# Patient Record
Sex: Female | Born: 1990
Health system: Southern US, Community
[De-identification: ages and names within clinical notes are randomized; demographics above are authoritative.]

## PROBLEM LIST (undated history)

## (undated) DIAGNOSIS — I495 Sick sinus syndrome: Secondary | ICD-10-CM

## (undated) DIAGNOSIS — Q249 Congenital malformation of heart, unspecified: Secondary | ICD-10-CM

## (undated) DIAGNOSIS — I441 Atrioventricular block, second degree: Secondary | ICD-10-CM

## (undated) DIAGNOSIS — I4892 Unspecified atrial flutter: Secondary | ICD-10-CM

## (undated) HISTORY — PX: SEPTOSTOMY: SHX2394

## (undated) HISTORY — PX: PACEMAKER INSERTION: SHX728

---
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
## 2011-11-26 DIAGNOSIS — I441 Atrioventricular block, second degree: Secondary | ICD-10-CM

## 2011-11-26 DIAGNOSIS — I495 Sick sinus syndrome: Secondary | ICD-10-CM

## 2011-11-26 HISTORY — DX: Atrioventricular block, second degree: I44.1

## 2011-11-26 HISTORY — DX: Sick sinus syndrome: I49.5

## 2013-07-01 DIAGNOSIS — I4892 Unspecified atrial flutter: Secondary | ICD-10-CM

## 2013-07-01 HISTORY — DX: Unspecified atrial flutter: I48.92

## 2013-11-25 HISTORY — PX: TYMPANOPLASTY W/ MASTOIDECTOMY: SUR1400

## 2014-09-15 ENCOUNTER — Ambulatory Visit: Payer: Self-pay | Admitting: Unknown Physician Specialty

## 2014-11-19 ENCOUNTER — Emergency Department (HOSPITAL_COMMUNITY)
Admission: EM | Admit: 2014-11-19 | Discharge: 2014-11-19 | Disposition: A | Discharge status: Home / Self Care | Payer: BC Managed Care – PPO | Attending: Emergency Medicine | Admitting: Emergency Medicine

## 2014-11-19 ENCOUNTER — Emergency Department (HOSPITAL_COMMUNITY): Payer: BC Managed Care – PPO

## 2014-11-19 ENCOUNTER — Emergency Department (INDEPENDENT_AMBULATORY_CARE_PROVIDER_SITE_OTHER)
Admission: EM | Admit: 2014-11-19 | Discharge: 2014-11-19 | Disposition: A | Discharge status: Other Acute Inpatient Hospital | Payer: BC Managed Care – PPO | Source: Home / Self Care | Attending: Family Medicine | Admitting: Family Medicine

## 2014-11-19 ENCOUNTER — Encounter (HOSPITAL_COMMUNITY): Payer: Self-pay | Admitting: Family Medicine

## 2014-11-19 ENCOUNTER — Encounter (HOSPITAL_COMMUNITY): Payer: Self-pay | Admitting: *Deleted

## 2014-11-19 DIAGNOSIS — Z8679 Personal history of other diseases of the circulatory system: Secondary | ICD-10-CM | POA: Insufficient documentation

## 2014-11-19 DIAGNOSIS — Z7982 Long term (current) use of aspirin: Secondary | ICD-10-CM | POA: Diagnosis not present

## 2014-11-19 DIAGNOSIS — R079 Chest pain, unspecified: Secondary | ICD-10-CM

## 2014-11-19 DIAGNOSIS — Q249 Congenital malformation of heart, unspecified: Secondary | ICD-10-CM | POA: Diagnosis not present

## 2014-11-19 DIAGNOSIS — Z950 Presence of cardiac pacemaker: Secondary | ICD-10-CM | POA: Diagnosis not present

## 2014-11-19 DIAGNOSIS — R0789 Other chest pain: Secondary | ICD-10-CM | POA: Diagnosis not present

## 2014-11-19 DIAGNOSIS — M25512 Pain in left shoulder: Secondary | ICD-10-CM | POA: Diagnosis not present

## 2014-11-19 DIAGNOSIS — Z79899 Other long term (current) drug therapy: Secondary | ICD-10-CM | POA: Insufficient documentation

## 2014-11-19 DIAGNOSIS — R52 Pain, unspecified: Secondary | ICD-10-CM

## 2014-11-19 HISTORY — DX: Atrioventricular block, second degree: I44.1

## 2014-11-19 HISTORY — DX: Congenital malformation of heart, unspecified: Q24.9

## 2014-11-19 HISTORY — DX: Unspecified atrial flutter: I48.92

## 2014-11-19 HISTORY — DX: Sick sinus syndrome: I49.5

## 2014-11-19 LAB — BASIC METABOLIC PANEL
Anion gap: 6 (ref 5–15)
BUN: 9 mg/dL (ref 6–23)
CHLORIDE: 108 meq/L (ref 96–112)
CO2: 24 mmol/L (ref 19–32)
CREATININE: 0.56 mg/dL (ref 0.50–1.10)
Calcium: 9.3 mg/dL (ref 8.4–10.5)
GFR calc Af Amer: >90 mL/min (ref >90)
GFR calc non Af Amer: >90 mL/min (ref >90)
GLUCOSE: 90 mg/dL (ref 70–99)
Potassium: 4.3 mmol/L (ref 3.5–5.1)
Sodium: 138 mmol/L (ref 135–145)

## 2014-11-19 LAB — CBC
HCT: 34.5 % — ABNORMAL LOW (ref 36.0–46.0)
HEMOGLOBIN: 11.5 g/dL — AB (ref 12.0–15.0)
MCH: 27.8 pg (ref 26.0–34.0)
MCHC: 33.3 g/dL (ref 30.0–36.0)
MCV: 83.5 fL (ref 78.0–100.0)
Platelets: 111 10*3/uL — ABNORMAL LOW (ref 150–400)
RBC: 4.13 MIL/uL (ref 3.87–5.11)
RDW: 14.7 % (ref 11.5–15.5)
WBC: 4 10*3/uL (ref 4.0–10.5)

## 2014-11-19 LAB — DIGOXIN LEVEL: DIGOXIN LVL: 0.6 ng/mL — AB (ref 0.8–2.0)

## 2014-11-19 LAB — I-STAT TROPONIN, ED: Troponin i, poc: 0 ng/mL (ref 0.00–0.08)

## 2014-11-19 LAB — BRAIN NATRIURETIC PEPTIDE: B NATRIURETIC PEPTIDE 5: 64.5 pg/mL (ref 0.0–100.0)

## 2014-11-19 MED ORDER — OXYCODONE-ACETAMINOPHEN 5-325 MG PO TABS
ORAL_TABLET | ORAL | Status: AC
Start: 2014-11-19 — End: ?

## 2014-11-19 MED ORDER — METHOCARBAMOL 500 MG PO TABS: 1000.0000 mg | ORAL_TABLET | Freq: Four times a day (QID) | ORAL | Status: AC | PRN

## 2014-11-19 MED ORDER — OXYCODONE-ACETAMINOPHEN 5-325 MG PO TABS
2.0000 | ORAL_TABLET | Freq: Once | ORAL | Status: AC
  Administered 2014-11-19: 2 via ORAL
  Filled 2014-11-19: qty 2

## 2014-11-19 NOTE — ED Provider Notes (Signed)
CSN: [id number]     Arrival date & time 11/19/14  1006 History   First MD Initiated Contact with Patient 11/19/14 1143     Chief Complaint  Patient presents with  . Chest Pain      HPI Pt was seen at 1150. Per pt, c/o gradual onset and persistence of constant left upper anterior chest wall and shoulder "pain" for the past 4 to 5 days, worse over the past 2 days. Describes the pain as "aching," constant, and worsens with movement and coughing. Pt has taken tylenol without improvement. Denies palpitations, no SOB/cough, no abd pain, no N/V/D, no injury, no rash, no fevers, no calf/LE pain or unilateral swelling.    Past Medical History  Diagnosis Date  . Heart defect, congenital   . Atrial flutter 07/01/2013    s/p DCCV  . Second degree AV block 2013    2:1 conduction, progressive  . Sinus node dysfunction 2013   Past Surgical History  Procedure Laterality Date  . Pacemaker insertion  1995 placement, 2014 replaced    [facility]  . 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 Septostomy      atrial septostomy at [facility])  . Bidirectional glenn w/ atrial septectomy  1992    [facility]  . Back surgery  2007    spinal fusion for kyphosis  . Tympanoplasty w/ mastoidectomy Right 2015    [facility]    History  Substance Use Topics  . Smoking status: Never Smoker   . Smokeless tobacco: Not on file  . Alcohol Use: Yes    Review of Systems ROS: Statement: All systems negative except as marked or noted in the HPI; Constitutional: Negative for fever and chills. ; ; Eyes: Negative for eye pain, redness and discharge. ; ; ENMT: Negative for ear pain, hoarseness, nasal congestion, sinus pressure and sore throat. ; ; Cardiovascular: Negative for palpitations, diaphoresis, dyspnea and peripheral edema. ; ; Respiratory: Negative for cough, wheezing and stridor. ; ; Gastrointestinal: Negative for nausea, vomiting, diarrhea, abdominal pain, blood in stool, hematemesis,  jaundice and rectal bleeding. . ; ; Genitourinary: Negative for dysuria, flank pain and hematuria. ; ; Musculoskeletal: +upper chest wall and shoulder pain. Negative for back pain and neck pain. Negative for swelling and trauma.; ; Skin: Negative for pruritus, rash, abrasions, blisters, bruising and skin lesion.; ; Neuro: Negative for headache, lightheadedness and neck stiffness. Negative for weakness, altered level of consciousness , altered mental status, extremity weakness, paresthesias, involuntary movement, seizure and syncope.      Allergies  Propoxyphene  Home Medications   Prior to Admission medications   Medication Sig Start Date End Date Taking? Authorizing Provider  aspirin 325 MG tablet Take 325 mg by mouth daily.   Yes Historical Provider, MD  digoxin (LANOXIN) 0.125 MG tablet Take 0.125 mg by mouth daily.   Yes Historical Provider, MD  lisinopril (PRINIVIL,ZESTRIL) 5 MG tablet Take 5 mg by mouth daily.   Yes Historical Provider, MD  metoprolol succinate (TOPROL-XL) 25 MG 24 hr tablet Take 25 mg by mouth daily.   Yes Historical Provider, MD   BP 116/67 mmHg  Pulse 69  Temp(Src) 98.5 F (36.9 C) (Oral)  Resp 22  Ht 5\' 2"  (1.575 m)  Wt 170 lb (77.111 kg)  BMI 31.09 kg/m2  SpO2 94%  LMP 10/09/2014 Physical Exam  1155: Physical examination:  Nursing notes reviewed; Vital signs and O2 SAT reviewed;  Constitutional: Well developed, Well  nourished, Well hydrated, In no acute distress; Head:  Normocephalic, atraumatic; Eyes: EOMI, PERRL, No scleral icterus; ENMT: Mouth and pharynx normal, Mucous membranes moist; Neck: Supple, Full range of motion, No lymphadenopathy; Cardiovascular: Regular rate and rhythm, No gallop; Respiratory: Breath sounds clear & equal bilaterally, No wheezes.  Speaking full sentences with ease, Normal respiratory effort/excursion; Chest: +left upper anterior chest wall and anterior shoulder/deltoid area tender to palp, no rash, no deformity, no soft tissue  crepitus. Movement normal; Abdomen: Soft, Nontender, Nondistended, Normal bowel sounds; Genitourinary: No CVA tenderness; Extremities: Pulses normal, Left shoulder w/FROM.  +TTP anterior deltoid and upper chest wall areas, mild TTP left AC joint. Left clavicle NT, scapula NT, proximal humerus NT, biceps tendon NT over bicipital groove.  Motor strength at shoulder normal.  Sensation intact over deltoid region, distal NMS intact with left hand having intact and equal sensation and strength in the distribution of the median, radial, and ulnar nerve function compared to opposite side.  Strong radial pulse.  +FROM left elbow with intact motor strength biceps and triceps muscles to resistance. No edema, No calf edema or asymmetry.; Neuro: AA&Ox3, Major CN grossly intact.  Speech clear. No gross focal motor or sensory deficits in extremities.; Skin: Color normal, Warm, Dry.   ED Course  Procedures     EKG Interpretation   Date/Time:  Saturday November 19 2014 10:57:14 EST Ventricular Rate:  70 PR Interval:  184 QRS Duration: 136 QT Interval:  464 QTC Calculation: 501 R Axis:   90 Text Interpretation:  AV dual-paced rhythm When compared with ECG of  07/31/2001 No significant change was found Confirmed by [facility]  MD,  Nicholos JohnsKATHLEEN [phone]([id number]) on 11/19/2014 11:10:21 AM      MDM  MDM Reviewed: previous chart, nursing note and vitals Reviewed previous: labs and ECG Interpretation: labs, ECG and x-ray     Results for orders placed or performed during the hospital encounter of 11/19/14  CBC  Result Value Ref Range   WBC 4.0 4.0 - 10.5 K/uL   RBC 4.13 3.87 - 5.11 MIL/uL   Hemoglobin 11.5 (L) 12.0 - 15.0 g/dL   HCT 60.434.5 (L) 54.036.0 - 98.146.0 %   MCV 83.5 78.0 - 100.0 fL   MCH 27.8 26.0 - 34.0 pg   MCHC 33.3 30.0 - 36.0 g/dL   RDW 19.114.7 47.811.5 - 29.515.5 %   Platelets 111 (L) 150 - 400 K/uL  Basic metabolic panel  Result Value Ref Range   Sodium 138 135 - 145 mmol/L   Potassium 4.3 3.5 - 5.1 mmol/L    Chloride 108 96 - 112 mEq/L   CO2 24 19 - 32 mmol/L   Glucose, Bld 90 70 - 99 mg/dL   BUN 9 6 - 23 mg/dL   Creatinine, Ser 6.210.56 0.50 - 1.10 mg/dL   Calcium 9.3 8.4 - 30.810.5 mg/dL   GFR calc non Af Amer >90 >90 mL/min   GFR calc Af Amer >90 >90 mL/min   Anion gap 6 5 - 15  BNP (order ONLY if patient complains of dyspnea/SOB AND you have documented it for THIS visit)  Result Value Ref Range   B Natriuretic Peptide 64.5 0.0 - 100.0 pg/mL  Digoxin level  Result Value Ref Range   Digoxin Level 0.6 (L) 0.8 - 2.0 ng/mL  I-stat troponin, ED (not at [facility])  Result Value Ref Range   Troponin i, poc 0.00 0.00 - 0.08 ng/mL   Comment 3  Dg Chest 2 View 11/19/2014   CLINICAL DATA:  Left upper chest pain and anterior left shoulder pain 3 days worse since yesterday. No injury.  EXAM: CHEST  2 VIEW  COMPARISON:  08/31/2013  FINDINGS: Lungs are hypoinflated without focal consolidation or effusion. There is mild cardiomegaly unchanged. Posterior stabilization hardware is present from approximately T4 to the region of the lower lumbar spine and is incompletely visualized. The visualized portion of the hardware is intact. Minimal spondylosis of the spine.  IMPRESSION: No acute cardiopulmonary per  Mild stable cardiomegaly.   Electronically Signed   By: Elberta Fortisaniel  Boyle M.D.   On: 11/19/2014 13:55   Dg Shoulder Left 11/19/2014   CLINICAL DATA:  Left upper chest in shoulder pain for 4 days  EXAM: LEFT SHOULDER - 2+ VIEW  COMPARISON:  None.  FINDINGS: There is no evidence of fracture or dislocation. There is no evidence of arthropathy or other focal bone abnormality. Soft tissues are unremarkable.  IMPRESSION: Negative.   Electronically Signed   By: Maryclare BeanArt  Hoss M.D.   On: 11/19/2014 13:59    1405:  Feels better after pain meds and wants to go home now. Doubt PE as cause for symptoms with low risk [place].  Doubt ACS as cause for symptoms with normal troponin and unchanged EKG from previous after 4 days of constant  symptoms.  Appears msk pain. [facility] Selmer DominionIdriss, case discussed, including:  HPI, pertinent PM/SHx, VS/PE, dx testing, ED course and treatment:  Agrees with ED evaluation, requests to tx symptomatically at this time, d/c pt and have her f/u in office. He also spoke directly to pt and her father. Pt and her father are satisfied with this arrangement and want to go home now. Dx and testing d/w pt and family.  Questions answered.  Verb understanding, agreeable to d/c home with outpt f/u.    Samuel JesterKathleen Khyson Sebesta, DO 11/22/14 1447

## 2014-11-19 NOTE — Discharge Instructions (Signed)
°Emergency Department Resource Guide °1) Find a Doctor and Pay Out of Pocket °Although you won't have to find out who is covered by your insurance plan, it is a good idea to ask around and get recommendations. You will then need to call the office and see if the doctor you have chosen will accept you as a new patient and what types of options they offer for patients who are self-pay. Some doctors offer discounts or will set up payment plans for their patients who do not have insurance, but you will need to ask so you aren't surprised when you get to your appointment. ° °2) Contact Your Local Health Department °Not all health departments have doctors that can see patients for sick visits, but many do, so it is worth a call to see if yours does. If you don't know where your local health department is, you can check in your phone book. The CDC also has a tool to help you locate your state's health department, and many state websites also have listings of all of their local health departments. ° °3) Find a Walk-in Clinic °If your illness is not likely to be very severe or complicated, you may want to try a walk in clinic. These are popping up all over the country in pharmacies, drugstores, and shopping centers. They're usually staffed by nurse practitioners or physician assistants that have been trained to treat common illnesses and complaints. They're usually fairly quick and inexpensive. However, if you have serious medical issues or chronic medical problems, these are probably not your best option. ° °No Primary Care Doctor: °- Call Health Connect at  832-8000 - they can help you locate a primary care doctor that  accepts your insurance, provides certain services, etc. °- Physician Referral Service- 1-800-533-3463 ° °Chronic Pain Problems: °Organization         Address  Phone   Notes  °[facility]  (336) [phone] Patients need to be referred by their primary care doctor.  ° °Medication  Assistance: °Organization         Address  Phone   Notes  °[place] [facility] [address] °[place], [place] [address] (336) 641-8030 --Must be a resident of [place] °-- Must have NO insurance coverage whatsoever (no Medicaid/ Medicare, etc.) °-- The pt. MUST have a primary care doctor that directs their care regularly and follows them in the community °  °[facility]  (866) 331-1348   °[employer]  (888) 892-1162   ° °Agencies that provide inexpensive medical care: °Organization         Address  Phone   Notes  °[facility]  (336) 832-8035   °[facility]    (336) 832-7272   °[facility] [address] °[place], [place] [address] (336) 832-4777   °[facility] [place] [facility] [address], °[place] (336) 271-4999   °[facility]    (336) 373-0678   °[facility]    (336) 272-1050   °[facility] ° [address], [place] Phone:  (336) 832-4444, Fax:  (336) 832-4440 Hours of Operation:  9 am - 6 pm, M-F.  Also accepts Medicaid/Medicare and self-pay.  °[facility] ° [address], [place] Phone: (336) [phone], Fax: (336) [phone]. Hours of Operation:  8:30 am - 5:30 pm, M-F.  Also accepts Medicaid and self-pay.  °[facility] [place] [address], [place] Phone: (336) 878-6027   °[facility] [address], [place], [place] (336)723-1848, Ext. 123 Mondays & Thursdays: 7-9 AM.  First 15 patients are seen on a first come, first serve basis. °  ° °Medicaid-accepting [place] Providers: ° °Organization         Address  Phone   Notes  °[facility] [address], [place] (336) 641-2100 Also accepts self-pay patients.  °[facility] [address], [place] ° (336) 856-9996   °[facility] [address], [place]  (336) 288-8857   °[facility] [address] [place] [address], [place] (336) 299-7000   °Veita Bland [address], [place]  ° (336) 373-1557 Only accepts [place] Access Medicaid patients after they have their name applied to their card.  ° °Self-Pay (no insurance) in [place]: ° °Organization         Address  Phone   Notes  °Sickle Cell Patients, [facility] [address], [place] (336) 832-1970   °[facility] [address], [place] (336) 832-4400   °[facility] ° [address], [place] (336) 992-4800   °[facility]/Dr. Osei-Bonsu ° [address] [place] [address], [place] or [address], [place] (336) [phone] Phone number for both [place] and [place] locations is the same.  °[facility] [address], [place] (336) 299-0000   °[facility] [address] [place] [address], [place] or [address] (336) 852-7530 °(336) 878-2260   °[facility] [address], [place] (336) 350-1642, phone; (336) 294-5005, fax Sees patients 1st and 3rd Saturday of every month.  Must not qualify for public or private insurance (i.e. Medicaid, Medicare, [place] Health Choice, Veterans' Benefits) • Household income should be no more than 200% of the poverty level •The clinic cannot treat you if you are pregnant or think you are pregnant • Sexually transmitted diseases are not treated at the clinic.  ° ° °Dental Care: °Organization         Address  Phone  Notes  °[place] [facility] [address], [place] (336) 641-6152 Accepts children up to age 21 who are enrolled in Medicaid or [place] Health Choice; pregnant women with a Medicaid card; and children who have applied for Medicaid or [place] Health Choice, but were declined, whose parents can pay a reduced fee at time of service.  °[place]  [facility] [place]  [address], [place] (336) 641-7733 Accepts children up to age 21 who are enrolled in Medicaid or [place] Health Choice; pregnant women with a Medicaid card; and children who have applied for Medicaid or [place] Health Choice, but were declined, whose parents can pay a reduced fee at time of service.  °[facility] ° [address], [place] (336) 641-4533 Patients are seen by appointment only. Walk-ins are not accepted. [facility] will see patients 18 years of age and older. °Monday - Tuesday (8am-5pm) °Most Wednesdays (8:30-5pm) °$30 per visit, cash only  °[facility] ° [address], [place] (336) 641-4533 Patients are seen by appointment only. Walk-ins are not accepted. [facility] will see patients 18 years of age and older. °One   Wednesday Evening (Monthly: Volunteer Based).  $30 per visit, cash only  °[facility]  (919) 537-3737 for adults; Children under age 4, call Graduate Pediatric Dentistry at ([phone]) [phone]. Children aged 4-14, please call (919) [phone] to request a pediatric application. ° Dental services are provided in all areas of dental care including fillings, crowns and bridges, complete and partial dentures, implants, gum treatment, root canals, and extractions. Preventive care is also provided. Treatment is provided to both adults and children. °Patients are selected via a lottery and there is often a waiting list. °  °[facility] [address], °[place] ° (336) 763-8833 [email or web address] °  °[facility] [address], [place], [place] (336)723-1848, Ext. 123 Second and Fourth Thursday of each month, opens at 6:30 AM; Clinic ends at 9 AM.  Patients are seen on a first-come first-served basis, and a limited number are seen during each clinic.  ° °[facility] ° [address], [place], [place] (336) 723-7904    Eligibility Requirements °You must have lived in [place], [place], or [place] counties for at least the last three months. °  You cannot be eligible for state or federal sponsored healthcare insurance, including Veterans Administration, Medicaid, or Medicare. °  You generally cannot be eligible for healthcare insurance through your employer.  °  How to apply: °Eligibility screenings are held every Tuesday and Wednesday afternoon from 1:00 pm until 4:00 pm. You do not need an appointment for the interview!  °[facility] [address], [place], [place] 336-631-2330   °[facility]  336-342-8273   °[facility]  336-703-3100   °[facility]  336-570-6415   ° °Behavioral Health Resources in the Community: °Intensive Outpatient Programs °Organization         Address  Phone  Notes  °[facility] [address], [place], [place] 336-878-6098   °[facility] [address], [place], [place] 336-832-9800   °[facility] [address], [place], [place] ° 336-882-2125   °[facility] [address],  °[place], [place] 1-800-853-5163 or 336-641-4981   °Substance Abuse Resources °Organization         Address  Phone  Notes  °[facility]  336-882-2125   °[facility]  336-784-9470   °[facility]  336-285-9073   °[facility]  336-845-3988   °Residential & Outpatient Substance Abuse Program  1-800-659-3381   °Psychological Services °Organization         Address  Phone  Notes  °[facility]  336- 832-9600   °[facility]  336- 378-7881   °[facility] [address], [place] 1-800-853-5163 or 336-641-4981   ° °Mobile Crisis Teams °Organization         Address  Phone  Notes  °[facility], Mobile Crisis Care Unit  1-877-626-1772   °Assertive °[facility] ° [address]  [place], [place] 336-834-9664   °Sharon DeEsch [address] °[place] 336-554-5454   ° °Self-Help/Support Groups °Organization         Address  Phone             Notes  °[facility]oc[facility] of  - variety of support groups  336- 373-1402 Call for more information  °Narcotics Anonymous (NA), [facility] [address], °[place]  2 meetings at this location  ° °  Residential Treatment Programs Organization         Address  Phone  Notes  [facility] [address],    [place]  [phone]   [facility]  [address], [place] [phone], [place], [place] [phone]   [facility] [address], [place] [phone] Admissions: 8am-3pm M-F  [facility] [address]    [place], [place] 295-284-1324(281)[phone]   [facility] [address], [place], [place] [phone]   [facility] [address]  [place], [place] [phone]([phone]) [phone]   [facility] Intensive Outpatient [address], [place], [place] [phone]   [facility] ([facility] [address]  [place], [place] [phone] or ([phone]   [facility] ([facility] [address] [place], [place] [phone] Accepts Medicaid  [facility] [address]  [place] [phone] Substance Abuse/Addiction Treatment   [facility] Organization         Address  Phone  Notes  [facility]  [phone](888) (616)560-4782   Angie FavaJulie Brannon, PhD [address], [address] [place], [place]   [phone](336) [phone] or [phone](336) (281)[phone]   [facility]   [address] [place], [place] [phone](336) [phone]   [facility] [address], [place], [place] [phone](336) [phone] Insurance/Medicaid/sponsorship through [facility] [address]                                    [place], [place] ([phone](336) [phone] Therapy/tele-psych/case    [facility] [address]   [place], [place] [phone](336) [phone]    Dr. Lolly MustacheArfeen  [phone](336) [phone]   [facility] [place]  [employer] [facility] 1) [address], [place] 2) [address], [place] 3)  [address], [place] [phone](336) (205) [phone](336) [phone]  [phone](336) [phone]   [facility] Child Abuse Hotline (331) 023-7082(336) (567) 606-6090 or 919-160-9020(336) [phone] (After Hours)      Take the prescriptions as directed.  Apply moist heat or ice to the area(s) of discomfort, for 15 minutes at a time, several times per day for the next few days.  Do not fall asleep on a heating or ice pack.  Call your regular Cardiologist on Monday to schedule a follow up appointment in the next 3 days.  Return to the Emergency Department immediately if worsening.

## 2014-11-19 NOTE — Discharge Instructions (Signed)
I would strongly encourage you to have yourself evaluated at [facility] immediately upon discharge from our facility to determine the origin of your discomfort.  Chest Pain (Nonspecific) It is often hard to give a specific diagnosis for the cause of chest pain. There is always a chance that your pain could be related to something serious, such as a heart attack or a blood clot in the lungs. You need to follow up with your health care provider for further evaluation. CAUSES   Heartburn.  Pneumonia or bronchitis.  Anxiety or stress.  Inflammation around your heart (pericarditis) or lung (pleuritis or pleurisy).  A blood clot in the lung.  A collapsed lung (pneumothorax). It can develop suddenly on its own (spontaneous pneumothorax) or from trauma to the chest.  Shingles infection (herpes zoster virus). The chest wall is composed of bones, muscles, and cartilage. Any of these can be the source of the pain.  The bones can be bruised by injury.  The muscles or cartilage can be strained by coughing or overwork.  The cartilage can be affected by inflammation and become sore (costochondritis). DIAGNOSIS  Lab tests or other studies may be needed to find the cause of your pain. Your health care provider may have you take a test called an ambulatory electrocardiogram (ECG). An ECG records your heartbeat patterns over a 24-hour period. You may also have other tests, such as:  Transthoracic echocardiogram (TTE). During echocardiography, sound waves are used to evaluate how blood flows through your heart.  Transesophageal echocardiogram (TEE).  Cardiac monitoring. This allows your health care provider to monitor your heart rate and rhythm in real time.  Holter monitor. This is a portable device that records your heartbeat and can help diagnose heart arrhythmias. It allows your health care provider to track your heart activity for several days, if needed.  Stress tests by exercise or by  giving medicine that makes the heart beat faster. TREATMENT   Treatment depends on what may be causing your chest pain. Treatment may include:  Acid blockers for heartburn.  Anti-inflammatory medicine.  Pain medicine for inflammatory conditions.  Antibiotics if an infection is present.  You may be advised to change lifestyle habits. This includes stopping smoking and avoiding alcohol, caffeine, and chocolate.  You may be advised to keep your head raised (elevated) when sleeping. This reduces the chance of acid going backward from your stomach into your esophagus. Most of the time, nonspecific chest pain will improve within 2-3 days with rest and mild pain medicine.  HOME CARE INSTRUCTIONS   If antibiotics were prescribed, take them as directed. Finish them even if you start to feel better.  For the next few days, avoid physical activities that bring on chest pain. Continue physical activities as directed.  Do not use any tobacco products, including cigarettes, chewing tobacco, or electronic cigarettes.  Avoid drinking alcohol.  Only take medicine as directed by your health care provider.  Follow your health care provider's suggestions for further testing if your chest pain does not go away.  Keep any follow-up appointments you made. If you do not go to an appointment, you could develop lasting (chronic) problems with pain. If there is any problem keeping an appointment, call to reschedule. SEEK MEDICAL CARE IF:   Your chest pain does not go away, even after treatment.  You have a rash with blisters on your chest.  You have a fever. SEEK IMMEDIATE MEDICAL CARE IF:   You have increased chest pain  or pain that spreads to your arm, neck, jaw, back, or abdomen.  You have shortness of breath.  You have an increasing cough, or you cough up blood.  You have severe back or abdominal pain.  You feel nauseous or vomit.  You have severe weakness.  You faint.  You have  chills. This is an emergency. Do not wait to see if the pain will go away. Get medical help at once. Call your local emergency services (911 in [place] Do not drive yourself to the hospital. MAKE SURE YOU:   Understand these instructions.  Will watch your condition.  Will get help right away if you are not doing well or get worse. Document Released: 08/21/2005 Document Revised: 11/16/2013 Document Reviewed: 06/16/2008 [facility] Patient Information 2015 [place], [place]. This information is not intended to replace advice given to you by your health care provider. Make sure you discuss any questions you have with your health care provider.

## 2014-11-19 NOTE — ED Notes (Signed)
Pt  Reports     Chest  And   Shoulder  Pain  X  5  Days    Worse  On  Movement  Pt  Reports  The  Pain is intermittant  And  May last  For  sev  Hours      Pt  Has  A  History  Of a  Pacemaker     Pt  Is  Awake and  Alert  And  Oriented  She  Is  Sitting upright on  The  Exam table speaking in  Complete  sentances  Is  Awake  Alert  And  Oriented

## 2014-11-19 NOTE — ED Provider Notes (Signed)
CSN: [id number]     Arrival date & time 11/19/14  0912 History   First MD Initiated Contact with Patient 11/19/14 0919     Chief Complaint  Patient presents with  . Chest Pain   (Consider location/radiation/quality/duration/timing/severity/associated sxs/prior Treatment) HPI Comments: Patient states she developed left upper anterior chest discomfort on 11/15/2014 and symptoms have waxed and waned, but persisted, since.  S/P remote open heart surgery as child for congenital cardiac malformation and PPM placement.  Cardiologist: Dr. Selmer Domin[facility]Idriss [facility] (This provider has satellite office/clinic in [place] on Fridays).   Patient is a 23 y.o. female presenting with chest pain. The history is provided by the patient and a parent.  Chest Pain Pain location:  L chest Pain quality: aching   Pain radiates to:  L shoulder Pain radiates to the back: no   Pain severity:  Moderate Onset quality:  Gradual Duration:  5 days Progression:  Waxing and waning Chronicity:  New Worsened by:  Deep breathing, exertion, movement and coughing Associated symptoms: shortness of breath   Associated symptoms: no abdominal pain, no cough, no diaphoresis, no dizziness, no nausea, no near-syncope, no palpitations, no syncope, not vomiting and no weakness   Risk factors: surgery   Risk factors comment:  Congenital cardiac anomoly. S/P open heart surgery with PPM implantation    Past Medical History  Diagnosis Date  . Heart defect, congenital    Past Surgical History  Procedure Laterality Date  . Pacemaker insertion     History reviewed. No pertinent family history. History  Substance Use Topics  . Smoking status: Never Smoker   . Smokeless tobacco: Not on file  . Alcohol Use: Yes   OB History    No data available     Review of Systems  Constitutional: Negative for diaphoresis.  HENT: Negative.   Respiratory: Positive for shortness of breath. Negative for cough, chest tightness and wheezing.     Cardiovascular: Positive for chest pain. Negative for palpitations, leg swelling, syncope and near-syncope.  Gastrointestinal: Negative for nausea, vomiting and abdominal pain.  Neurological: Negative for dizziness and weakness.    Allergies  Propoxyphene  Home Medications   Prior to Admission medications   Not on File   BP 129/77 mmHg  Pulse 72  Temp(Src) 98.1 F (36.7 C) (Oral)  Resp 18  SpO2 92%  LMP 10/09/2014 Physical Exam  Constitutional: She is oriented to person, place, and time. She appears well-developed and well-nourished. No distress.  HENT:  Head: Normocephalic and atraumatic.  Eyes: Conjunctivae are normal. No scleral icterus.  Neck: Normal range of motion. Neck supple.  Cardiovascular: Normal rate and regular rhythm.   Pulmonary/Chest: Effort normal and breath sounds normal. No respiratory distress. She has no wheezes. She has no rales. She exhibits tenderness. She exhibits no mass, no crepitus, no deformity and no retraction.    Abdominal: Soft. Bowel sounds are normal. She exhibits no distension. There is no tenderness.  Musculoskeletal: Normal range of motion.  Neurological: She is alert and oriented to person, place, and time.  Skin: Skin is warm and dry. No rash noted. No erythema.  Psychiatric: She has a normal mood and affect. Her behavior is normal.  Nursing note and vitals reviewed.   ED Course  Procedures (including critical care time) Labs Review Labs Reviewed - No data to display  Imaging Review No results found.   MDM   1. Chest pain, unspecified chest pain type    Attempted to [facility]  through Care Everywhere, however, no documents have been loaded.  ECG: Dual chamber AV paced at rate of 70 bpm. No previous ECGs available for comparison.  Will require evaluation beyond the scope [facility]. Ddx includes PTX & PE. Currently hemodynamically stable. Patient here with father who would like to provide  private vehicle transportation [facility].     Mathis FareJennifer Lee H UrsaPresson, [place] 11/19/14 [phone]

## 2014-11-19 NOTE — ED Notes (Signed)
Per pt sts left sided chest pain, pain with breathing and moving since the 22nd. sts she feels SOB. Pt cardiac hx.

## 2014-12-19 ENCOUNTER — Other Ambulatory Visit: Payer: Self-pay | Admitting: Sports Medicine

## 2014-12-19 DIAGNOSIS — M25562 Pain in left knee: Secondary | ICD-10-CM

## 2014-12-19 DIAGNOSIS — M25561 Pain in right knee: Secondary | ICD-10-CM

## 2014-12-20 ENCOUNTER — Ambulatory Visit (HOSPITAL_COMMUNITY)
Admission: RE | Admit: 2014-12-20 | Discharge: 2014-12-20 | Disposition: A | Discharge status: Home / Self Care | Payer: BLUE CROSS/BLUE SHIELD | Source: Ambulatory Visit | Attending: Sports Medicine | Admitting: Sports Medicine

## 2014-12-20 ENCOUNTER — Encounter (HOSPITAL_COMMUNITY): Payer: Self-pay

## 2014-12-20 DIAGNOSIS — M25562 Pain in left knee: Secondary | ICD-10-CM | POA: Diagnosis not present

## 2014-12-20 MED ORDER — IOHEXOL 180 MG/ML  SOLN
30.0000 mL | Freq: Once | INTRAMUSCULAR | Status: AC | PRN
  Administered 2014-12-20: 30 mL via INTRAVENOUS

## 2014-12-22 ENCOUNTER — Other Ambulatory Visit: Payer: Self-pay

## 2015-03-24 IMAGING — CT CT TEMPORAL BONES WITHOUT CONTRAST
2 of 6 series · 11 of 40 positions shown, 14 images · non-contrast
Comparison: None.

CLINICAL DATA: Conductive hearing loss with BILATERAL ear pain.

EXAM:
CT TEMPORAL BONES WITHOUT CONTRAST
TECHNIQUE: Axial and coronal plane CT imaging of the petrous temporal bones was
performed with thin-collimation image reconstruction. No intravenous
contrast was administered. Multiplanar CT image reconstructions were
also generated.

[Series 5: coronal bone · coronal · 0.10mm/px · 2 of 150 slices shown]
[im 50/150  bone]
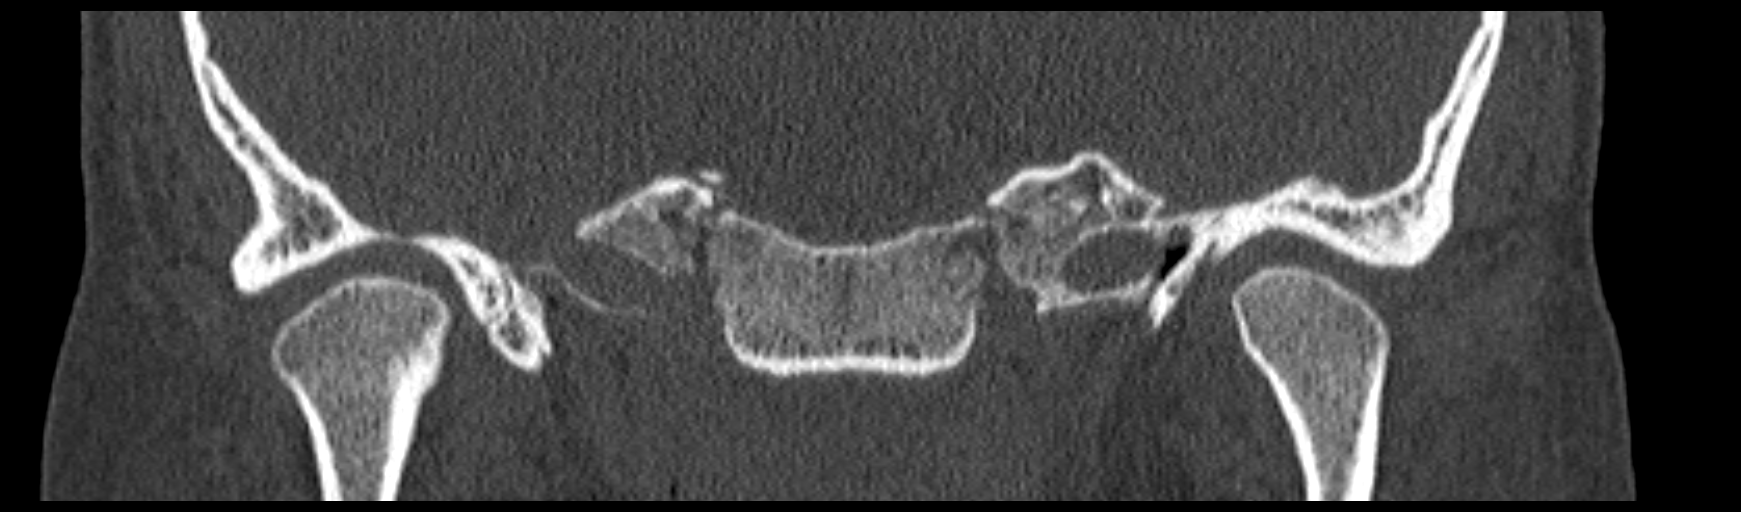
[im 100/150  bone]
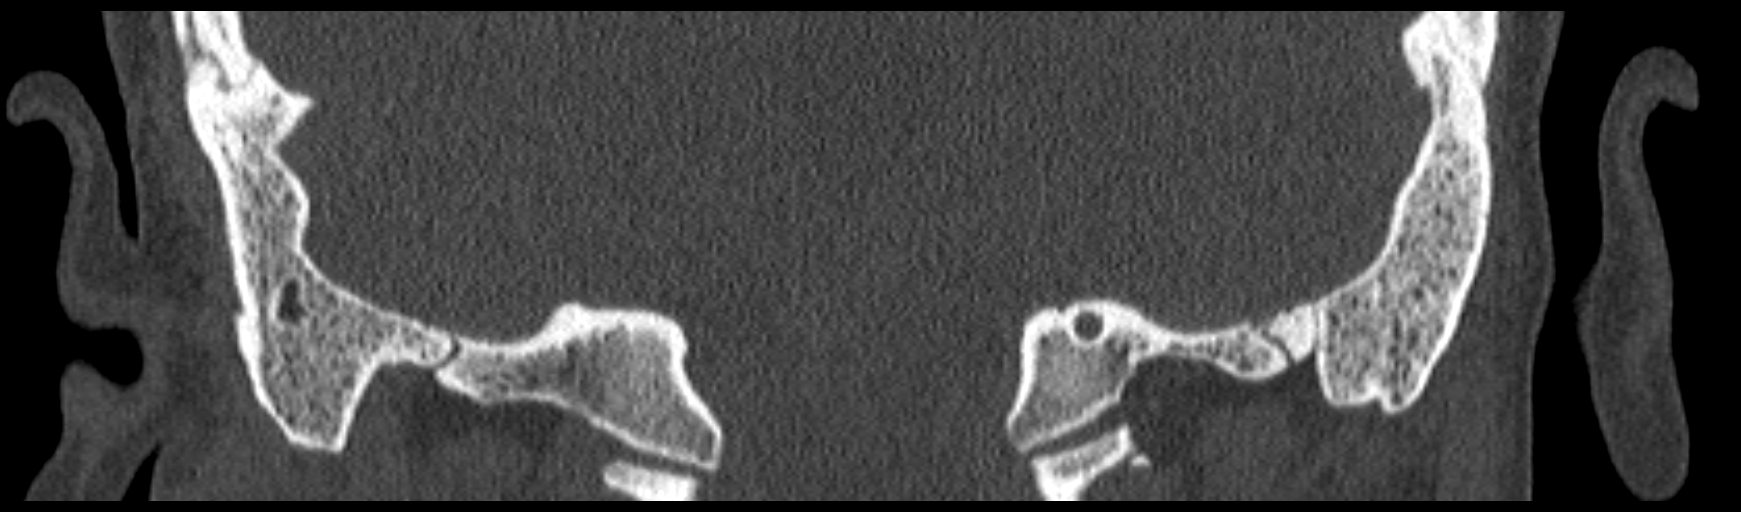

[Series 6: ax mag right · axial · 0.20mm/px · z∈[-42,-2]mm · 9 of 81 slices shown, 12 images]
[im 7/81  brain]
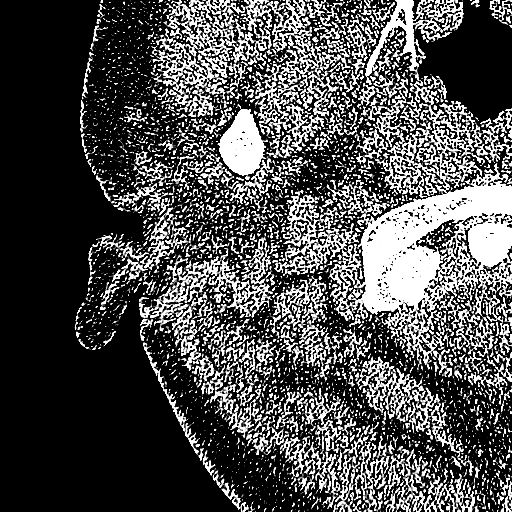
[im 7/81  bone]
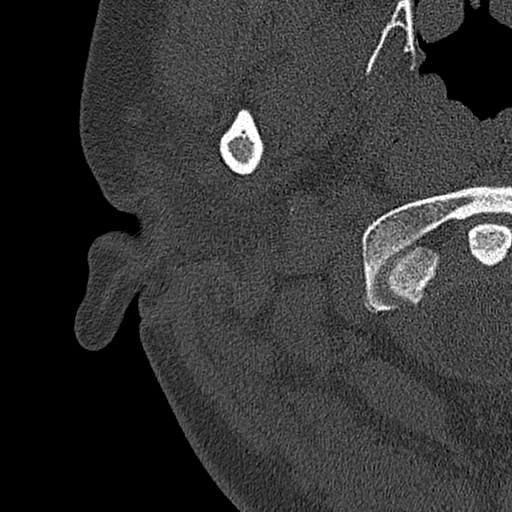
[im 14/81  bone]
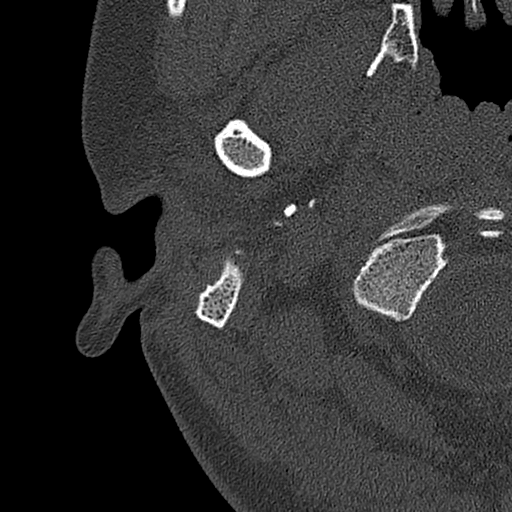
[im 27/81  bone]
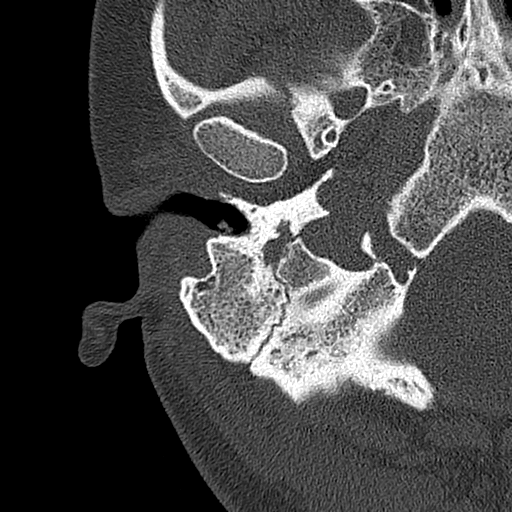
[im 34/81  bone]
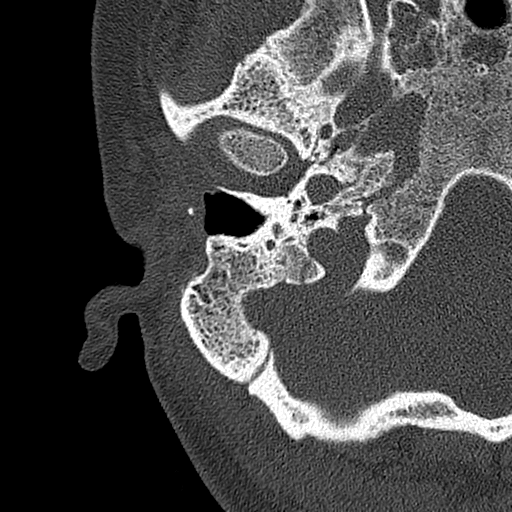
[im 41/81  brain]
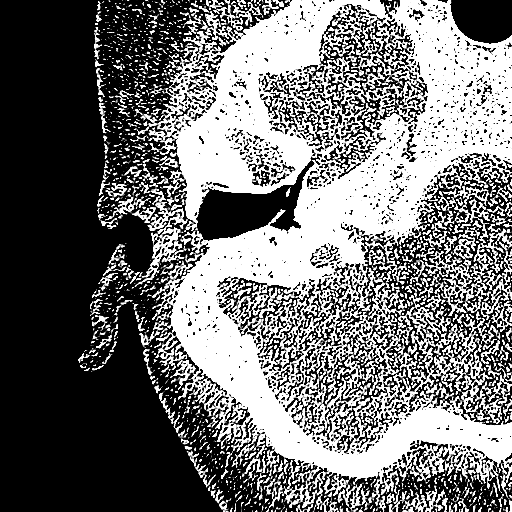
[im 41/81  bone]
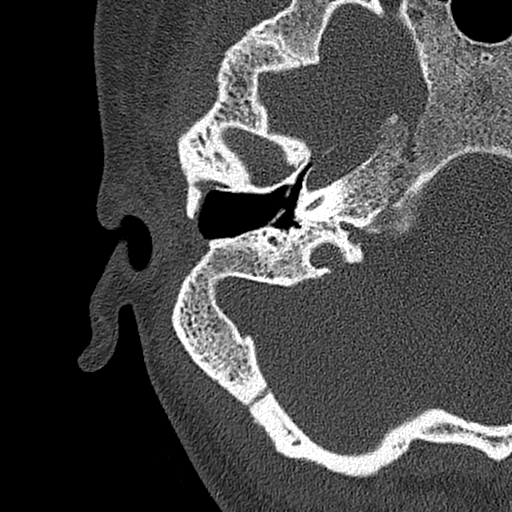
[im 47/81  bone]
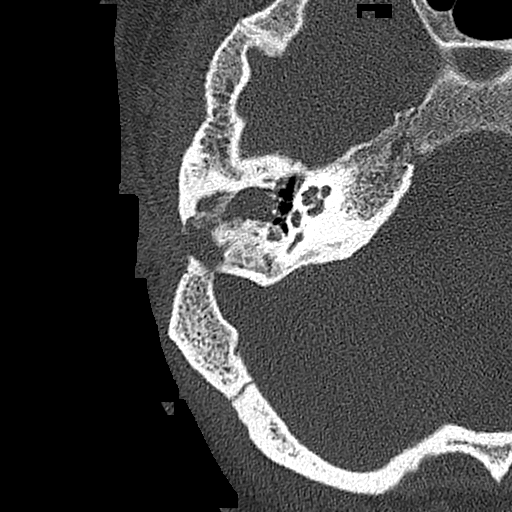
[im 54/81  bone]
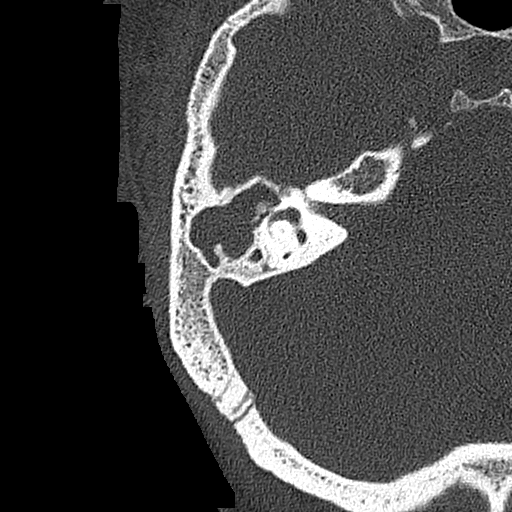
[im 67/81  bone]
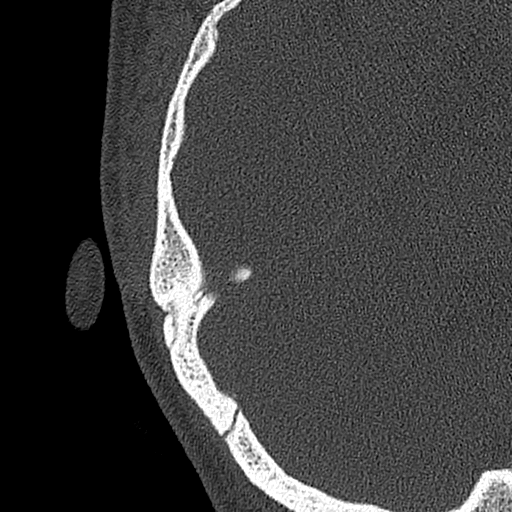
[im 74/81  brain]
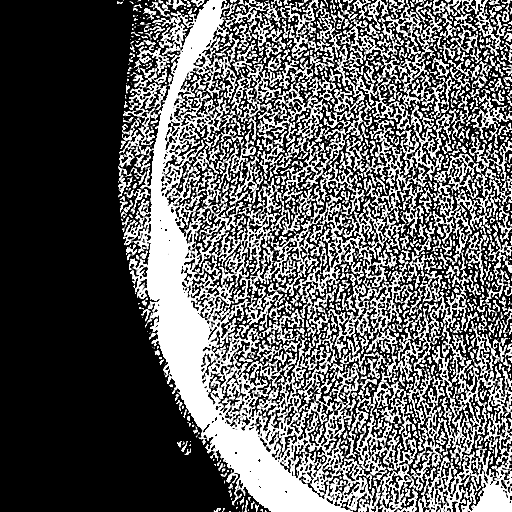
[im 74/81  bone]
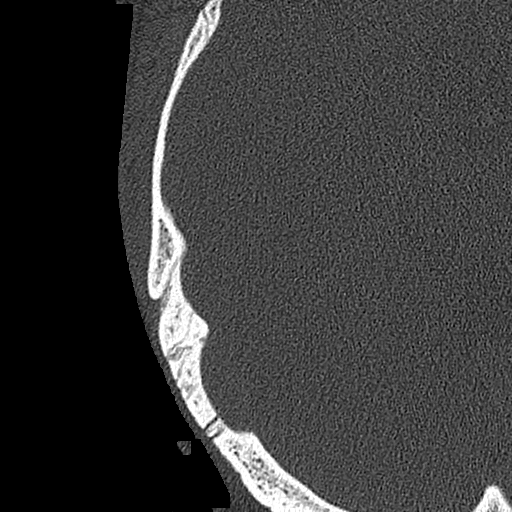

[11 of 40 positions shown; findings below may reference images not displayed]

FINDINGS: RIGHT EAR: There is a small soft tissue density on the posterior
aspect of the bony external canal which could represent a polyp,
cerumen, or external cholesteatoma. No associated osseous reaction.

The tympanic membrane is retracted, thickened, and perforated. The
scutum is absent. There appears to an ossicular prosthesis in the
region of the head of the malleus. There may be surrounding
dystrophic calcification. The incus and stapes are poorly
visualized.

There is a large cholesteatoma in the attic and aditus ad antrum,
extending posteriorly into the mastoid measuring approximately 11 x
19 x 10 mm. There is thinning of the tegmen tympani and mastoideum.
There is probable dehiscence of the lateral semicircular canal
(image 54 series 6 arrow). The lateral mastoid wall is absent
posterior and inferior to this cholesteatoma, which has likely
recurred within an area of previous mastoidectomy.

LEFT EAR: Normal external canal. Tympanic membrane delicate. Normal
ossicles. No middle ear fluid or soft tissue density. Normal LEFT
mastoid. Unremarkable inner ear structures.

Intracranial compartment:  No visible abnormality.

There is a ground-glass appearance to the bone lateral and inferior
to the LEFT maxillary sinus, less so on the RIGHT. This area is
incompletely evaluated but could represent changes of fibrous
dysplasia.
IMPRESSION: Postoperative changes in the RIGHT ear with suspected recurrent
cholesteatoma. Apparent metallic malleus ossicular prosthesis, with
thickened, retracted, perforated RIGHT TM, and deficient ossicular
chain.

Thinning of the tegmen tympani and mastoideum. Probable dehiscent
RIGHT lateral semicircular canal.

## 2015-06-28 IMAGING — RF DG FLUORO GUIDE NDL PLC/BX
1 series · 1 of 1 positions shown · non-contrast
Comparison: none

CLINICAL DATA: Left knee pain.  Prior patellar dislocations.

[Series 1: cp_standard · 0.18mm/px · 1 of 1 slices shown]
[im 1/1]
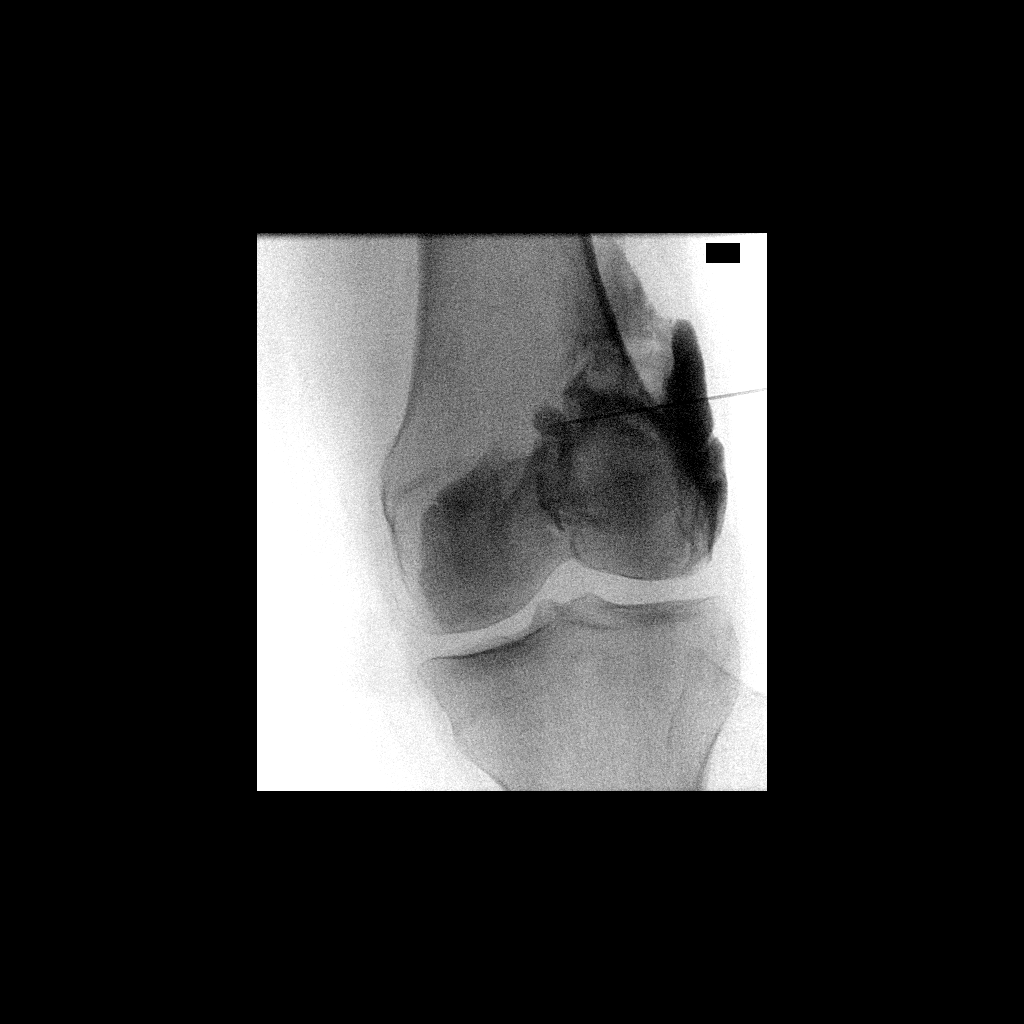

[1 of 1 positions shown; findings below may reference images not displayed]

EXAM:
Left knee INJECTION FOR CT

FLUOROSCOPY TIME:  Dictate in minutes and seconds

PROCEDURE:
Overlying skin prepped with Betadine, draped in the usual sterile
fashion, and infiltrated locally with Lidocaine. Curved 22 gauge
spinal needle advanced to the superolateral margin of the left knee
joint. 1 ml of Lidocaine injected easily. 35 mL dilute Omnipaque 180
was then used to opacify the left knee joint. No immediate
complication.
IMPRESSION: Technically successful left knee injection under fluoroscopy for CT
arthrogram.

## 2018-10-23 ENCOUNTER — Ambulatory Visit (HOSPITAL_COMMUNITY)
Admission: EM | Admit: 2018-10-23 | Discharge: 2018-10-23 | Disposition: A | Discharge status: Home / Self Care | Payer: BLUE CROSS/BLUE SHIELD | Attending: Family Medicine | Admitting: Family Medicine

## 2018-10-23 ENCOUNTER — Encounter (HOSPITAL_COMMUNITY): Payer: Self-pay | Admitting: Emergency Medicine

## 2018-10-23 DIAGNOSIS — H02841 Edema of right upper eyelid: Secondary | ICD-10-CM | POA: Diagnosis not present

## 2018-10-23 DIAGNOSIS — H01001 Unspecified blepharitis right upper eyelid: Secondary | ICD-10-CM

## 2018-10-23 MED ORDER — TOBRAMYCIN 0.3 % OP SOLN: 1.0000 [drp] | Freq: Four times a day (QID) | OPHTHALMIC | 0 refills | Status: AC

## 2018-10-23 NOTE — ED Triage Notes (Signed)
Pt c/o R eyelid redness and swelling, denies pain, states its been happening x3 days. States "it feels raw".

## 2018-10-23 NOTE — ED Provider Notes (Signed)
[facility]   [id number] 10/23/18 Arrival Time: 1519  ASSESSMENT & PLAN:  1. Swelling of right upper eyelid   2. Blepharitis of right upper eyelid, unspecified type    Meds ordered this encounter  Medications  . tobramycin (TOBREX) 0.3 % ophthalmic solution    Sig: Place 1 drop into the right eye every 6 (six) hours.    Dispense:  5 mL    Refill:  0   Ophthalmic drops per orders. Local eye care discussed.  Will f/u if not seeing improvement over the next few days.    Visual Acuity  Right Eye Distance:   Left Eye Distance:   Bilateral Distance:    Right Eye Near: R Near: 20/50 Left Eye Near:  L Near: 20/20 Bilateral Near:  20/20  Reviewed expectations re: course of current medical issues. Questions answered. Outlined signs and symptoms indicating need for more acute intervention. Patient verbalized understanding. After Visit Summary given.   SUBJECTIVE:  Erin Castro is a 27 y.o. female who presents with complaint of persistent R upper eyelid swelling. Onset gradual, approximately 2-3 days ago. Injury: no.  Visual changes: "a little blurry"; transient; questions related to slight watery drainage. Contact lens use: no. Self treatment: none reported. No h/o similar. No photophobia or specific eye pain reported.  ROS: As per HPI.  OBJECTIVE:  Vitals:   10/23/18 1601  BP: 129/66  Pulse: 83  Resp: 16  Temp: (!) 97.2 F (36.2 C)  SpO2: 94%    General appearance: alert; no distress Eyes: mild swelling and erythema of upper left eyelid; minimal dry debris if fold of eyelid; I cannot tell if she has a possible early stye of lateral upper left eyelid; PERRLA; EOMI without associated pain; no orbital swelling or erythema Neck: supple without LAD Lungs: clear to auscultation bilaterally Heart: regular rate and rhythm Skin: warm and dry Psychological: alert and cooperative; normal mood and affect  Allergies  Allergen Reactions  . Propoxyphene  Other (See Comments)    " mental issues"     Past Medical History:  Diagnosis Date  . Atrial flutter (HCC) 07/01/2013   s/p DCCV  . Heart defect, congenital   . Second degree AV block 2013   2:1 conduction, progressive  . Sinus node dysfunction (HCC) 2013   Social History   Socioeconomic History  . Marital status: Single    Spouse name: Not on file  . Number of children: Not on file  . Years of education: Not on file  . Highest education level: Not on file  Occupational History  . Not on file  Social Needs  . Financial resource strain: Not on file  . Food insecurity:    Worry: Not on file    Inability: Not on file  . Transportation needs:    Medical: Not on file    Non-medical: Not on file  Tobacco Use  . Smoking status: Never Smoker  Substance and Sexual Activity  . Alcohol use: Yes  . Drug use: Not on file  . Sexual activity: Not on file  Lifestyle  . Physical activity:    Days per week: Not on file    Minutes per session: Not on file  . Stress: Not on file  Relationships  . Social connections:    Talks on phone: Not on file    Gets together: Not on file    Attends religious service: Not on file    Active member of club or organization: Not  on file    Attends meetings of clubs or organizations: Not on file    Relationship status: Not on file  . Intimate partner violence:    Fear of current or ex partner: Not on file    Emotionally abused: Not on file    Physically abused: Not on file    Forced sexual activity: Not on file  Other Topics Concern  . Not on file  Social History Narrative  . Not on file   No family history on file. Past Surgical History:  Procedure Laterality Date  . BACK SURGERY  2007   spinal fusion for kyphosis  . BIDIRECTIONAL GLENN W/ ATRIAL SEPTECTOMY  1992   [facility]  . FONTAN PROCEDURE, INTRACARDIAC  1994   [facility]  . PACEMAKER INSERTION  1995 placement, 2014 replaced   [facility]  . SEPTOSTOMY     atrial septostomy  at birth [facility])  . TYMPANOPLASTY W/ MASTOIDECTOMY Right 2015   Sherre Lain, MD 10/23/18 1932

## 2019-09-09 ENCOUNTER — Other Ambulatory Visit: Payer: Self-pay

## 2019-09-09 DIAGNOSIS — Z20822 Contact with and (suspected) exposure to covid-19: Secondary | ICD-10-CM

## 2019-09-10 LAB — NOVEL CORONAVIRUS, NAA: SARS-CoV-2, NAA: NOT DETECTED
